# Patient Record
Sex: Female | Born: 1950 | Race: White | Hispanic: No | Marital: Married | Smoking: Former smoker
Health system: Southern US, Community
[De-identification: ages and names within clinical notes are randomized; demographics above are authoritative.]

## PROBLEM LIST (undated history)

## (undated) DIAGNOSIS — G4730 Sleep apnea, unspecified: Secondary | ICD-10-CM

## (undated) DIAGNOSIS — I2510 Atherosclerotic heart disease of native coronary artery without angina pectoris: Secondary | ICD-10-CM

## (undated) DIAGNOSIS — M1990 Unspecified osteoarthritis, unspecified site: Secondary | ICD-10-CM

## (undated) DIAGNOSIS — I10 Essential (primary) hypertension: Secondary | ICD-10-CM

## (undated) DIAGNOSIS — E119 Type 2 diabetes mellitus without complications: Secondary | ICD-10-CM

## (undated) HISTORY — PX: BACK SURGERY: SHX140

## (undated) HISTORY — PX: CARDIAC CATHETERIZATION: SHX172

---
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
## 1992-01-19 HISTORY — PX: HERNIA REPAIR: SHX51

## 1994-01-18 HISTORY — PX: OTHER SURGICAL HISTORY: SHX169

## 1994-01-18 HISTORY — PX: HERNIA REPAIR: SHX51

## 1994-01-18 HISTORY — PX: ABDOMINAL HYSTERECTOMY: SHX81

## 2000-01-19 HISTORY — PX: CARPAL TUNNEL RELEASE: SHX101

## 2002-01-18 HISTORY — PX: OTHER SURGICAL HISTORY: SHX169

## 2006-01-18 HISTORY — PX: CATARACT EXTRACTION: SUR2

## 2013-12-18 HISTORY — PX: CORONARY ARTERY BYPASS GRAFT: SHX141

## 2015-01-19 HISTORY — PX: CARPAL TUNNEL RELEASE: SHX101

## 2015-01-19 HISTORY — PX: CATARACT EXTRACTION: SUR2

## 2015-01-19 HISTORY — PX: TRIGGER FINGER RELEASE: SHX641

## 2016-11-02 ENCOUNTER — Other Ambulatory Visit: Payer: Self-pay | Admitting: Neurological Surgery

## 2016-11-15 NOTE — Pre-Procedure Instructions (Signed)
Laura Randall  11/15/2016      [id number] - [place], [place] - [address] AT [facility] [address]) [address] [place] [address] Phone: [phone] Fax: [phone]    Your procedure is scheduled on Mon. Nov. 5  Report to [facility] Admitting at 11:15 A.M.  Call this number if you have problems the morning of surgery:  [phone]   Remember:  Do not eat food or drink liquids after midnight on Sun. Nov. 4   Take these medicines the morning of surgery with A SIP OF WATER :gabapentin (neurontin), metoprolol (toprol-XL) pantoprazole(protonix)                   How to Manage Your Diabetes Before and After Surgery  Why is it important to control my blood sugar before and after surgery? . Improving blood sugar levels before and after surgery helps healing and can limit problems. . A way of improving blood sugar control is eating a healthy diet by: o  Eating less sugar and carbohydrates o  Increasing activity/exercise o  Talking with your doctor about reaching your blood sugar goals . High blood sugars (greater than 180 mg/dL) can raise your risk of infections and slow your recovery, so you will need to focus on controlling your diabetes during the weeks before surgery. . Make sure that the doctor who takes care of your diabetes knows about your planned surgery including the date and location.  How do I manage my blood sugar before surgery? . Check your blood sugar at least 4 times a day, starting 2 days before surgery, to make sure that the level is not too high or low. o Check your blood sugar the morning of your surgery when you wake up and every 2 hours until you get to the Short Stay unit. . If your blood sugar is less than 70 mg/dL, you will need to treat for low blood sugar: o Do not take insulin. o Treat a low blood sugar (less than 70 mg/dL) with  cup of clear juice (cranberry or apple), 4  glucose tablets, OR glucose gel. o Recheck blood sugar in 15 minutes after treatment (to make sure it is greater than 70 mg/dL). If your blood sugar is not greater than 70 mg/dL on recheck, call [phone] for further instructions. . Report your blood sugar to the short stay nurse when you get to Short Stay.  . If you are admitted to the hospital after surgery: o Your blood sugar will be checked by the staff and you will probably be given insulin after surgery (instead of oral diabetes medicines) to make sure you have good blood sugar levels. o The goal for blood sugar control after surgery is 80-180 mg/dL.        WHAT DO I DO ABOUT MY DIABETES MEDICATION?   Marland Kitchen Do not take oral diabetes medicines (pills) the morning of surgery.  . THE NIGHT BEFORE SURGERY, take _______70____ units of ____humalog 75/25_______insulin.       . THE MORNING OF SURGERY, take ________none_____ units of ____humalog______insulin.   Other Instructions:                  7 days prior to surgery STOP taking any Aspirin (unless otherwise instructed by your surgeon), Aleve, Naproxen, Ibuprofen, Motrin, Advil, Goody's, BC's, all herbal medications, fish oil, and all vitamins   Do not  wear jewelry, make-up or nail polish.  Do not wear lotions, powders, or perfumes, or deoderant.  Do not shave 48 hours prior to surgery.  Men may shave face and neck.  Do not bring valuables to the hospital.  [facility] is not responsible for any belongings or valuables.  Contacts, dentures or bridgework may not be worn into surgery.  Leave your suitcase in the car.  After surgery it may be brought to your room.  For patients admitted to the hospital, discharge time will be determined by your treatment team.  Patients discharged the day of surgery will not be allowed to drive home.    Special instructions:   [facility]- Preparing For Surgery  Before surgery, you can play an important role. Because skin is not sterile,  your skin needs to be as free of germs as possible. You can reduce the number of germs on your skin by washing with CHG (chlorahexidine gluconate) Soap before surgery.  CHG is an antiseptic cleaner which kills germs and bonds with the skin to continue killing germs even after washing.  Please do not use if you have an allergy to CHG or antibacterial soaps. If your skin becomes reddened/irritated stop using the CHG.  Do not shave (including legs and underarms) for at least 48 hours prior to first CHG shower. It is OK to shave your face.  Please follow these instructions carefully.   1. Shower the NIGHT BEFORE SURGERY and the MORNING OF SURGERY with CHG.   2. If you chose to wash your hair, wash your hair first as usual with your normal shampoo.  3. After you shampoo, rinse your hair and body thoroughly to remove the shampoo.  4. Use CHG as you would any other liquid soap. You can apply CHG directly to the skin and wash gently with a scrungie or a clean washcloth.   5. Apply the CHG Soap to your body ONLY FROM THE NECK DOWN.  Do not use on open wounds or open sores. Avoid contact with your eyes, ears, mouth and genitals (private parts). Wash Face and genitals (private parts)  with your normal soap.  6. Wash thoroughly, paying special attention to the area where your surgery will be performed.  7. Thoroughly rinse your body with warm water from the neck down.  8. DO NOT shower/wash with your normal soap after using and rinsing off the CHG Soap.  9. Pat yourself dry with a CLEAN TOWEL.  10. Wear CLEAN PAJAMAS to bed the night before surgery, wear comfortable clothes the morning of surgery  11. Place CLEAN SHEETS on your bed the night of your first shower and DO NOT SLEEP WITH PETS.    Day of Surgery: Do not apply any deodorants/lotions. Please wear clean clothes to the hospital/surgery center.      Please read over the following fact sheets that you were given. Coughing and Deep  Breathing, MRSA Information and Surgical Site Infection Prevention

## 2016-11-16 ENCOUNTER — Encounter (HOSPITAL_COMMUNITY)
Admission: RE | Admit: 2016-11-16 | Discharge: 2016-11-16 | Disposition: A | Discharge status: Home / Self Care | Payer: 59 | Source: Ambulatory Visit | Attending: Neurological Surgery | Admitting: Neurological Surgery

## 2016-11-16 ENCOUNTER — Encounter (HOSPITAL_COMMUNITY): Payer: Self-pay | Admitting: *Deleted

## 2016-11-16 DIAGNOSIS — Z79899 Other long term (current) drug therapy: Secondary | ICD-10-CM | POA: Insufficient documentation

## 2016-11-16 DIAGNOSIS — Z7982 Long term (current) use of aspirin: Secondary | ICD-10-CM | POA: Diagnosis not present

## 2016-11-16 DIAGNOSIS — Z01818 Encounter for other preprocedural examination: Secondary | ICD-10-CM | POA: Insufficient documentation

## 2016-11-16 DIAGNOSIS — Z794 Long term (current) use of insulin: Secondary | ICD-10-CM | POA: Insufficient documentation

## 2016-11-16 DIAGNOSIS — M48062 Spinal stenosis, lumbar region with neurogenic claudication: Secondary | ICD-10-CM | POA: Insufficient documentation

## 2016-11-16 HISTORY — DX: Type 2 diabetes mellitus without complications: E11.9

## 2016-11-16 HISTORY — DX: Sleep apnea, unspecified: G47.30

## 2016-11-16 HISTORY — DX: Atherosclerotic heart disease of native coronary artery without angina pectoris: I25.10

## 2016-11-16 HISTORY — DX: Essential (primary) hypertension: I10

## 2016-11-16 HISTORY — DX: Unspecified osteoarthritis, unspecified site: M19.90

## 2016-11-16 LAB — GLUCOSE, CAPILLARY: GLUCOSE-CAPILLARY: 89 mg/dL (ref 65–99)

## 2016-11-16 LAB — SURGICAL PCR SCREEN
MRSA, PCR: NEGATIVE
Staphylococcus aureus: NEGATIVE

## 2016-11-16 LAB — BASIC METABOLIC PANEL
ANION GAP: 11 (ref 5–15)
BUN: 15 mg/dL (ref 6–20)
CALCIUM: 9.6 mg/dL (ref 8.9–10.3)
CO2: 25 mmol/L (ref 22–32)
CREATININE: 1.05 mg/dL — AB (ref 0.44–1.00)
Chloride: 101 mmol/L (ref 101–111)
GFR, EST NON AFRICAN AMERICAN: 55 mL/min — AB (ref >60)
Glucose, Bld: 94 mg/dL (ref 65–99)
Potassium: 3.6 mmol/L (ref 3.5–5.1)
Sodium: 137 mmol/L (ref 135–145)

## 2016-11-16 LAB — CBC
HCT: 39.9 % (ref 36.0–46.0)
HEMOGLOBIN: 13.7 g/dL (ref 12.0–15.0)
MCH: 30.2 pg (ref 26.0–34.0)
MCHC: 34.3 g/dL (ref 30.0–36.0)
MCV: 88.1 fL (ref 78.0–100.0)
PLATELETS: 208 10*3/uL (ref 150–400)
RBC: 4.53 MIL/uL (ref 3.87–5.11)
RDW: 13.5 % (ref 11.5–15.5)
WBC: 9 10*3/uL (ref 4.0–10.5)

## 2016-11-16 LAB — HEMOGLOBIN A1C
HEMOGLOBIN A1C: 7.6 % — AB (ref 4.8–5.6)
MEAN PLASMA GLUCOSE: 171.42 mg/dL

## 2016-11-16 NOTE — Progress Notes (Addendum)
PCP - debra renee Tegeler Cardiologist - dr. Selena BattenKim - requesting records  Chest x-ray - not needed EKG - 07/09/16 Stress Test - requesting ECHO - 2018 requesting Cardiac Cath - 2015 requesting from [facility]  Sending to anesthesia for review of records that have been requested   Fasting - 100-130 Checks 1-2 times a week  Wears CPAP at night Sleep study >25 years ago  Patient denies shortness of breath, fever, cough and chest pain at PAT appointment   Patient verbalized understanding of instructions that were given to them at the PAT appointment. Patient was also instructed that they will need to review over the PAT instructions again at home before surgery.

## 2016-11-17 ENCOUNTER — Encounter (HOSPITAL_COMMUNITY): Payer: Self-pay

## 2016-11-17 ENCOUNTER — Encounter (HOSPITAL_COMMUNITY): Payer: Self-pay | Admitting: Vascular Surgery
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
## 2016-11-18 ENCOUNTER — Other Ambulatory Visit: Payer: Self-pay | Admitting: Neurological Surgery

## 2016-11-19 MED ORDER — DEXTROSE 5 % IV SOLN
3.0000 g | INTRAVENOUS | Status: AC
  Administered 2016-11-22: 3 g via INTRAVENOUS
  Filled 2016-11-19 (×2): qty 3000

## 2016-11-22 ENCOUNTER — Ambulatory Visit (HOSPITAL_COMMUNITY): Payer: 59 | Admitting: Certified Registered Nurse Anesthetist

## 2016-11-22 ENCOUNTER — Observation Stay (HOSPITAL_COMMUNITY)
Admission: RE | Admit: 2016-11-22 | Discharge: 2016-11-23 | Disposition: A | Discharge status: Home / Self Care | Payer: 59 | Source: Ambulatory Visit | Attending: Neurological Surgery | Admitting: Neurological Surgery

## 2016-11-22 ENCOUNTER — Encounter (HOSPITAL_COMMUNITY): Payer: Self-pay | Admitting: Anesthesiology

## 2016-11-22 ENCOUNTER — Ambulatory Visit (HOSPITAL_COMMUNITY): Payer: 59

## 2016-11-22 ENCOUNTER — Encounter (HOSPITAL_COMMUNITY): Admission: RE | Payer: Self-pay | Source: Ambulatory Visit

## 2016-11-22 ENCOUNTER — Ambulatory Visit (HOSPITAL_COMMUNITY): Admission: RE | Admit: 2016-11-22 | Payer: 59 | Source: Ambulatory Visit | Admitting: Neurological Surgery

## 2016-11-22 ENCOUNTER — Encounter (HOSPITAL_COMMUNITY)
Admission: RE | Disposition: A | Discharge status: Home / Self Care | Payer: Self-pay | Source: Ambulatory Visit | Attending: Neurological Surgery

## 2016-11-22 DIAGNOSIS — Z87891 Personal history of nicotine dependence: Secondary | ICD-10-CM | POA: Diagnosis not present

## 2016-11-22 DIAGNOSIS — Z6841 Body Mass Index (BMI) 40.0 and over, adult: Secondary | ICD-10-CM | POA: Diagnosis not present

## 2016-11-22 DIAGNOSIS — K219 Gastro-esophageal reflux disease without esophagitis: Secondary | ICD-10-CM | POA: Insufficient documentation

## 2016-11-22 DIAGNOSIS — Z7982 Long term (current) use of aspirin: Secondary | ICD-10-CM | POA: Diagnosis not present

## 2016-11-22 DIAGNOSIS — M5126 Other intervertebral disc displacement, lumbar region: Secondary | ICD-10-CM | POA: Insufficient documentation

## 2016-11-22 DIAGNOSIS — Z951 Presence of aortocoronary bypass graft: Secondary | ICD-10-CM | POA: Diagnosis not present

## 2016-11-22 DIAGNOSIS — I2510 Atherosclerotic heart disease of native coronary artery without angina pectoris: Secondary | ICD-10-CM | POA: Insufficient documentation

## 2016-11-22 DIAGNOSIS — E119 Type 2 diabetes mellitus without complications: Secondary | ICD-10-CM | POA: Insufficient documentation

## 2016-11-22 DIAGNOSIS — G4730 Sleep apnea, unspecified: Secondary | ICD-10-CM | POA: Insufficient documentation

## 2016-11-22 DIAGNOSIS — Z419 Encounter for procedure for purposes other than remedying health state, unspecified: Secondary | ICD-10-CM

## 2016-11-22 DIAGNOSIS — I10 Essential (primary) hypertension: Secondary | ICD-10-CM | POA: Diagnosis not present

## 2016-11-22 DIAGNOSIS — M47816 Spondylosis without myelopathy or radiculopathy, lumbar region: Secondary | ICD-10-CM | POA: Diagnosis not present

## 2016-11-22 DIAGNOSIS — Z794 Long term (current) use of insulin: Secondary | ICD-10-CM | POA: Diagnosis not present

## 2016-11-22 DIAGNOSIS — Z79899 Other long term (current) drug therapy: Secondary | ICD-10-CM | POA: Insufficient documentation

## 2016-11-22 DIAGNOSIS — M48062 Spinal stenosis, lumbar region with neurogenic claudication: Secondary | ICD-10-CM | POA: Diagnosis not present

## 2016-11-22 HISTORY — PX: LUMBAR LAMINECTOMY/DECOMPRESSION MICRODISCECTOMY: SHX5026

## 2016-11-22 LAB — GLUCOSE, CAPILLARY
GLUCOSE-CAPILLARY: 314 mg/dL — AB (ref 65–99)
Glucose-Capillary: 145 mg/dL — ABNORMAL HIGH (ref 65–99)
Glucose-Capillary: 122 mg/dL — ABNORMAL HIGH (ref 65–99)
Glucose-Capillary: 116 mg/dL — ABNORMAL HIGH (ref 65–99)

## 2016-11-22 SURGERY — LUMBAR LAMINECTOMY/DECOMPRESSION MICRODISCECTOMY 2 LEVELS
Anesthesia: General | Site: Back | Laterality: Bilateral

## 2016-11-22 SURGERY — LUMBAR LAMINECTOMY WITH COFLEX 2 LEVEL
Anesthesia: General | Site: Back | Laterality: Bilateral

## 2016-11-22 MED ORDER — FLEET ENEMA 7-19 GM/118ML RE ENEM: 1.0000 | ENEMA | Freq: Once | RECTAL | Status: DC | PRN

## 2016-11-22 MED ORDER — ACETAMINOPHEN 325 MG PO TABS: 650.0000 mg | ORAL_TABLET | ORAL | Status: DC | PRN

## 2016-11-22 MED ORDER — SODIUM CHLORIDE 0.9% FLUSH
3.0000 mL | Freq: Two times a day (BID) | INTRAVENOUS | Status: DC
  Administered 2016-11-22: 3 mL via INTRAVENOUS

## 2016-11-22 MED ORDER — PANTOPRAZOLE SODIUM 40 MG PO TBEC
40.0000 mg | DELAYED_RELEASE_TABLET | Freq: Every day | ORAL | Status: DC
  Administered 2016-11-23: 40 mg via ORAL
  Filled 2016-11-22: qty 1

## 2016-11-22 MED ORDER — DOCUSATE SODIUM 100 MG PO CAPS
100.0000 mg | ORAL_CAPSULE | Freq: Two times a day (BID) | ORAL | Status: DC
  Administered 2016-11-22 – 2016-11-23 (×2): 100 mg via ORAL
  Filled 2016-11-22 (×2): qty 1

## 2016-11-22 MED ORDER — ROCURONIUM BROMIDE 10 MG/ML (PF) SYRINGE
PREFILLED_SYRINGE | INTRAVENOUS | Status: AC
  Filled 2016-11-22: qty 5

## 2016-11-22 MED ORDER — BISACODYL 10 MG RE SUPP: 10.0000 mg | Freq: Every day | RECTAL | Status: DC | PRN

## 2016-11-22 MED ORDER — DEXAMETHASONE SODIUM PHOSPHATE 10 MG/ML IJ SOLN
INTRAMUSCULAR | Status: AC
  Filled 2016-11-22: qty 1

## 2016-11-22 MED ORDER — METHOCARBAMOL 500 MG PO TABS
500.0000 mg | ORAL_TABLET | Freq: Four times a day (QID) | ORAL | Status: DC | PRN
  Administered 2016-11-22 – 2016-11-23 (×2): 500 mg via ORAL
  Filled 2016-11-22 (×2): qty 1

## 2016-11-22 MED ORDER — CEFAZOLIN SODIUM-DEXTROSE 2-4 GM/100ML-% IV SOLN
2.0000 g | Freq: Three times a day (TID) | INTRAVENOUS | Status: AC
  Administered 2016-11-22 – 2016-11-23 (×2): 2 g via INTRAVENOUS
  Filled 2016-11-22 (×2): qty 100

## 2016-11-22 MED ORDER — LIDOCAINE-EPINEPHRINE 1 %-1:100000 IJ SOLN
INTRAMUSCULAR | Status: DC | PRN
  Administered 2016-11-22: 5 mL

## 2016-11-22 MED ORDER — INSULIN LISPRO PROT & LISPRO (75-25 MIX) 100 UNIT/ML KWIKPEN: 95.0000 [IU] | PEN_INJECTOR | SUBCUTANEOUS | Status: DC

## 2016-11-22 MED ORDER — THROMBIN (RECOMBINANT) 5000 UNITS EX SOLR
OROMUCOSAL | Status: DC | PRN
  Administered 2016-11-22: 5 mL via TOPICAL

## 2016-11-22 MED ORDER — ONDANSETRON HCL 4 MG PO TABS: 4.0000 mg | ORAL_TABLET | Freq: Four times a day (QID) | ORAL | Status: DC | PRN

## 2016-11-22 MED ORDER — METOPROLOL SUCCINATE ER 25 MG PO TB24
100.0000 mg | ORAL_TABLET | Freq: Every day | ORAL | Status: DC
  Filled 2016-11-22: qty 4

## 2016-11-22 MED ORDER — GABAPENTIN 100 MG PO CAPS
100.0000 mg | ORAL_CAPSULE | Freq: Two times a day (BID) | ORAL | Status: DC
  Administered 2016-11-22 – 2016-11-23 (×2): 100 mg via ORAL
  Filled 2016-11-22 (×2): qty 1

## 2016-11-22 MED ORDER — ONDANSETRON HCL 4 MG/2ML IJ SOLN
INTRAMUSCULAR | Status: DC | PRN
  Administered 2016-11-22: 4 mg via INTRAVENOUS

## 2016-11-22 MED ORDER — BUPIVACAINE HCL (PF) 0.5 % IJ SOLN
INTRAMUSCULAR | Status: DC | PRN
  Administered 2016-11-22: 20 mL
  Administered 2016-11-22: 5 mL

## 2016-11-22 MED ORDER — ONDANSETRON HCL 4 MG/2ML IJ SOLN: 4.0000 mg | Freq: Four times a day (QID) | INTRAMUSCULAR | Status: DC | PRN

## 2016-11-22 MED ORDER — SODIUM CHLORIDE 0.9% FLUSH: 3.0000 mL | INTRAVENOUS | Status: DC | PRN

## 2016-11-22 MED ORDER — MENTHOL 3 MG MT LOZG
1.0000 | LOZENGE | OROMUCOSAL | Status: DC | PRN
  Filled 2016-11-22: qty 9

## 2016-11-22 MED ORDER — FENTANYL CITRATE (PF) 100 MCG/2ML IJ SOLN: 25.0000 ug | INTRAMUSCULAR | Status: DC | PRN

## 2016-11-22 MED ORDER — FENTANYL CITRATE (PF) 250 MCG/5ML IJ SOLN
INTRAMUSCULAR | Status: AC
  Filled 2016-11-22: qty 5

## 2016-11-22 MED ORDER — SENNA 8.6 MG PO TABS
1.0000 | ORAL_TABLET | Freq: Two times a day (BID) | ORAL | Status: DC
  Administered 2016-11-22 – 2016-11-23 (×2): 8.6 mg via ORAL
  Filled 2016-11-22 (×2): qty 1

## 2016-11-22 MED ORDER — GLIMEPIRIDE 2 MG PO TABS
8.0000 mg | ORAL_TABLET | Freq: Every day | ORAL | Status: DC
  Filled 2016-11-22 (×2): qty 4

## 2016-11-22 MED ORDER — ROCURONIUM BROMIDE 100 MG/10ML IV SOLN
INTRAVENOUS | Status: DC | PRN
  Administered 2016-11-22: 50 mg via INTRAVENOUS

## 2016-11-22 MED ORDER — PHENOL 1.4 % MT LIQD: 1.0000 | OROMUCOSAL | Status: DC | PRN

## 2016-11-22 MED ORDER — METHOCARBAMOL 1000 MG/10ML IJ SOLN
500.0000 mg | Freq: Four times a day (QID) | INTRAVENOUS | Status: DC | PRN
  Filled 2016-11-22: qty 5

## 2016-11-22 MED ORDER — EPHEDRINE SULFATE 50 MG/ML IJ SOLN
INTRAMUSCULAR | Status: DC | PRN
  Administered 2016-11-22: 5 mg via INTRAVENOUS

## 2016-11-22 MED ORDER — INSULIN ASPART PROT & ASPART (70-30 MIX) 100 UNIT/ML ~~LOC~~ SUSP
100.0000 [IU] | Freq: Every day | SUBCUTANEOUS | Status: DC
  Administered 2016-11-23: 100 [IU] via SUBCUTANEOUS

## 2016-11-22 MED ORDER — DEXAMETHASONE SODIUM PHOSPHATE 10 MG/ML IJ SOLN
INTRAMUSCULAR | Status: DC | PRN
  Administered 2016-11-22: 10 mg via INTRAVENOUS

## 2016-11-22 MED ORDER — PROPOFOL 10 MG/ML IV BOLUS
INTRAVENOUS | Status: DC | PRN
  Administered 2016-11-22: 120 mg via INTRAVENOUS

## 2016-11-22 MED ORDER — LOSARTAN POTASSIUM-HCTZ 100-25 MG PO TABS: 1.0000 | ORAL_TABLET | Freq: Every day | ORAL | Status: DC

## 2016-11-22 MED ORDER — LACTATED RINGERS IV SOLN
INTRAVENOUS | Status: DC
  Administered 2016-11-22 (×2): via INTRAVENOUS

## 2016-11-22 MED ORDER — HYDROCODONE-ACETAMINOPHEN 5-325 MG PO TABS
2.0000 | ORAL_TABLET | ORAL | Status: DC | PRN
  Administered 2016-11-22: 2 via ORAL
  Filled 2016-11-22: qty 2

## 2016-11-22 MED ORDER — LIDOCAINE 2% (20 MG/ML) 5 ML SYRINGE
INTRAMUSCULAR | Status: AC
  Filled 2016-11-22: qty 5

## 2016-11-22 MED ORDER — POLYETHYLENE GLYCOL 3350 17 G PO PACK: 17.0000 g | PACK | Freq: Every day | ORAL | Status: DC | PRN

## 2016-11-22 MED ORDER — LIDOCAINE HCL (CARDIAC) 20 MG/ML IV SOLN
INTRAVENOUS | Status: DC | PRN
  Administered 2016-11-22: 80 mg via INTRAVENOUS

## 2016-11-22 MED ORDER — SUCCINYLCHOLINE CHLORIDE 200 MG/10ML IV SOSY
PREFILLED_SYRINGE | INTRAVENOUS | Status: AC
  Filled 2016-11-22: qty 10

## 2016-11-22 MED ORDER — PROPOFOL 10 MG/ML IV BOLUS
INTRAVENOUS | Status: AC
  Filled 2016-11-22: qty 20

## 2016-11-22 MED ORDER — DIPHENHYDRAMINE HCL 25 MG PO CAPS
50.0000 mg | ORAL_CAPSULE | ORAL | Status: DC | PRN
  Administered 2016-11-22: 50 mg via ORAL
  Filled 2016-11-22: qty 2

## 2016-11-22 MED ORDER — ALUM & MAG HYDROXIDE-SIMETH 200-200-20 MG/5ML PO SUSP: 30.0000 mL | Freq: Four times a day (QID) | ORAL | Status: DC | PRN

## 2016-11-22 MED ORDER — SUGAMMADEX SODIUM 200 MG/2ML IV SOLN
INTRAVENOUS | Status: DC | PRN
  Administered 2016-11-22: 200 mg via INTRAVENOUS

## 2016-11-22 MED ORDER — LOSARTAN POTASSIUM 50 MG PO TABS
100.0000 mg | ORAL_TABLET | Freq: Every day | ORAL | Status: DC
  Administered 2016-11-22 – 2016-11-23 (×2): 100 mg via ORAL
  Filled 2016-11-22 (×2): qty 2

## 2016-11-22 MED ORDER — HYDROMORPHONE HCL 1 MG/ML IJ SOLN
0.5000 mg | INTRAMUSCULAR | Status: DC | PRN
  Administered 2016-11-23: 0.5 mg via INTRAVENOUS
  Filled 2016-11-22: qty 0.5

## 2016-11-22 MED ORDER — ACETAMINOPHEN 650 MG RE SUPP: 650.0000 mg | RECTAL | Status: DC | PRN

## 2016-11-22 MED ORDER — SODIUM CHLORIDE 0.9 % IR SOLN
Status: DC | PRN
  Administered 2016-11-22: 500 mL

## 2016-11-22 MED ORDER — INSULIN ASPART PROT & ASPART (70-30 MIX) 100 UNIT/ML ~~LOC~~ SUSP
95.0000 [IU] | Freq: Every day | SUBCUTANEOUS | Status: DC
  Administered 2016-11-22: 95 [IU] via SUBCUTANEOUS
  Filled 2016-11-22 (×2): qty 10

## 2016-11-22 MED ORDER — FENTANYL CITRATE (PF) 100 MCG/2ML IJ SOLN
INTRAMUSCULAR | Status: DC | PRN
  Administered 2016-11-22: 150 ug via INTRAVENOUS
  Administered 2016-11-22 (×2): 50 ug via INTRAVENOUS

## 2016-11-22 MED ORDER — PROMETHAZINE HCL 25 MG/ML IJ SOLN: 6.2500 mg | INTRAMUSCULAR | Status: DC | PRN

## 2016-11-22 MED ORDER — EPHEDRINE 5 MG/ML INJ
INTRAVENOUS | Status: AC
  Filled 2016-11-22: qty 10

## 2016-11-22 MED ORDER — SUGAMMADEX SODIUM 200 MG/2ML IV SOLN
INTRAVENOUS | Status: AC
  Filled 2016-11-22: qty 2

## 2016-11-22 MED ORDER — PHENYLEPHRINE 40 MCG/ML (10ML) SYRINGE FOR IV PUSH (FOR BLOOD PRESSURE SUPPORT)
PREFILLED_SYRINGE | INTRAVENOUS | Status: AC
  Filled 2016-11-22: qty 10

## 2016-11-22 MED ORDER — MIDAZOLAM HCL 2 MG/2ML IJ SOLN
INTRAMUSCULAR | Status: AC
  Filled 2016-11-22: qty 2

## 2016-11-22 MED ORDER — HYDROCHLOROTHIAZIDE 25 MG PO TABS
25.0000 mg | ORAL_TABLET | Freq: Every day | ORAL | Status: DC
  Administered 2016-11-22 – 2016-11-23 (×2): 25 mg via ORAL
  Filled 2016-11-22 (×2): qty 1

## 2016-11-22 MED ORDER — ONDANSETRON HCL 4 MG/2ML IJ SOLN
INTRAMUSCULAR | Status: AC
  Filled 2016-11-22: qty 2

## 2016-11-22 SURGICAL SUPPLY — 48 items
BAG DECANTER FOR FLEXI CONT (MISCELLANEOUS) ×2 IMPLANT
BLADE CLIPPER SURG (BLADE) IMPLANT
BLADE SURG 10 STRL SS (BLADE) ×2 IMPLANT
BUR ACORN 6.0 (BURR) IMPLANT
BUR MATCHSTICK NEURO 3.0 LAGG (BURR) ×2 IMPLANT
CANISTER SUCT 3000ML PPV (MISCELLANEOUS) ×2 IMPLANT
CARTRIDGE OIL MAESTRO DRILL (MISCELLANEOUS) ×1 IMPLANT
DECANTER SPIKE VIAL GLASS SM (MISCELLANEOUS) ×2 IMPLANT
DERMABOND ADVANCED (GAUZE/BANDAGES/DRESSINGS) ×1
DERMABOND ADVANCED .7 DNX12 (GAUZE/BANDAGES/DRESSINGS) ×1 IMPLANT
DIFFUSER DRILL AIR PNEUMATIC (MISCELLANEOUS) ×2 IMPLANT
DRAPE HALF SHEET 40X57 (DRAPES) IMPLANT
DRAPE LAPAROTOMY 100X72X124 (DRAPES) ×2 IMPLANT
DRAPE MICROSCOPE LEICA (MISCELLANEOUS) IMPLANT
DRAPE POUCH INSTRU U-SHP 10X18 (DRAPES) ×2 IMPLANT
DRSG OPSITE POSTOP 3X4 (GAUZE/BANDAGES/DRESSINGS) ×2 IMPLANT
DURAPREP 26ML APPLICATOR (WOUND CARE) ×2 IMPLANT
ELECT REM PT RETURN 9FT ADLT (ELECTROSURGICAL) ×2
ELECTRODE REM PT RTRN 9FT ADLT (ELECTROSURGICAL) ×1 IMPLANT
GAUZE SPONGE 4X4 12PLY STRL (GAUZE/BANDAGES/DRESSINGS) ×2 IMPLANT
GAUZE SPONGE 4X4 16PLY XRAY LF (GAUZE/BANDAGES/DRESSINGS) IMPLANT
GLOVE BIOGEL PI IND STRL 8.5 (GLOVE) ×2 IMPLANT
GLOVE BIOGEL PI INDICATOR 8.5 (GLOVE) ×2
GLOVE ECLIPSE 8.5 STRL (GLOVE) ×2 IMPLANT
GOWN STRL REUS W/ TWL LRG LVL3 (GOWN DISPOSABLE) ×1 IMPLANT
GOWN STRL REUS W/ TWL XL LVL3 (GOWN DISPOSABLE) IMPLANT
GOWN STRL REUS W/TWL 2XL LVL3 (GOWN DISPOSABLE) ×2 IMPLANT
GOWN STRL REUS W/TWL LRG LVL3 (GOWN DISPOSABLE) ×1
GOWN STRL REUS W/TWL XL LVL3 (GOWN DISPOSABLE)
HEMOSTAT POWDER KIT SURGIFOAM (HEMOSTASIS) ×2 IMPLANT
KIT BASIN OR (CUSTOM PROCEDURE TRAY) ×2 IMPLANT
KIT ROOM TURNOVER OR (KITS) ×2 IMPLANT
NEEDLE HYPO 22GX1.5 SAFETY (NEEDLE) ×2 IMPLANT
NEEDLE SPNL 20GX3.5 QUINCKE YW (NEEDLE) IMPLANT
NS IRRIG 1000ML POUR BTL (IV SOLUTION) ×2 IMPLANT
OIL CARTRIDGE MAESTRO DRILL (MISCELLANEOUS) ×2
PACK LAMINECTOMY NEURO (CUSTOM PROCEDURE TRAY) ×2 IMPLANT
PAD ARMBOARD 7.5X6 YLW CONV (MISCELLANEOUS) ×6 IMPLANT
PATTIES SURGICAL .5 X1 (DISPOSABLE) ×2 IMPLANT
RUBBERBAND STERILE (MISCELLANEOUS) IMPLANT
SPONGE SURGIFOAM ABS GEL SZ50 (HEMOSTASIS) IMPLANT
SUT VIC AB 1 CT1 18XBRD ANBCTR (SUTURE) ×1 IMPLANT
SUT VIC AB 1 CT1 8-18 (SUTURE) ×1
SUT VIC AB 2-0 CP2 18 (SUTURE) ×2 IMPLANT
SUT VIC AB 3-0 SH 8-18 (SUTURE) ×2 IMPLANT
TOWEL GREEN STERILE (TOWEL DISPOSABLE) ×2 IMPLANT
TOWEL GREEN STERILE FF (TOWEL DISPOSABLE) ×2 IMPLANT
WATER STERILE IRR 1000ML POUR (IV SOLUTION) ×2 IMPLANT

## 2016-11-22 NOTE — Transfer of Care (Signed)
Immediate Anesthesia Transfer of Care Note  Patient: Laura Randall  Procedure(s) Performed: Bilateral Limbar two -three,  Lumbar three-four Laminotomies (Bilateral Back)  Patient Location: PACU  Anesthesia Type:General  Level of Consciousness: awake, alert , oriented and patient cooperative  Airway & Oxygen Therapy: Patient Spontanous Breathing and Patient connected to nasal cannula oxygen  Post-op Assessment: Report given to RN, Post -op Vital signs reviewed and stable and Patient moving all extremities  Post vital signs: Reviewed and stable  Last Vitals:  Vitals:   11/22/16 1126  BP: (!) 181/76  Pulse: 90  Resp: 20  Temp: 36.7 C  SpO2: 98%    Last Pain:  Vitals:   11/22/16 1155  TempSrc:   PainSc: 8       Patients Stated Pain Goal: 3 (11/22/16 1155)  Complications: No apparent anesthesia complications

## 2016-11-22 NOTE — H&P (Addendum)
Chief complaint: pain in right knee and leg History of present illness: Laura Randall is a 66 year old right-handed individual who's had back issues in the past. [place] 2007 [place]on [place]Brown [place] she had decompression with placement of an X stop device at L4-L5.She had primarily left lower extremity pain at the time. She feels that didn't help as she is Left leg pain but now she started developed right leg pain. Seen an orthopedist sometime ago and was told that she had a stress fracture.She wore boot for a total of 10 days and noted that the pain did not seem to improve at all. She's had severe increase in pain an MRI of lumbar spine was ultimately performed. She notes that she has not had any back pain per se but notes that she's had left leg pain has persisted from before. She notes pain radiates from the crease of her hips down into the leg she describes that there is severe pain on the anterior border of the thigh. She tells me that over the past years she's had a number of epidural injections. She's had some injections in the hip and greater trochanter. The right leg pain she feels unrelenting. Having failed efforts at conservative management including repeat injections by myself we noted that L4-L5 as a widely patent canal but she has significant stenosis with central disc protrusion at L3-4. She is been advised regarding surgical decompression of this level and is now admitted for this process.   Past medical history reveals that she is diabetic and is on Humalog 95 units twice a day. She is on glimepiride pantoprazole metoprolol ER losartan buffered aspirin in addition to vitamin D. Allergies include hydrocodone morphine oxycodone metformin pravastatin and phentermine.  Physical examination: She is alert and oriented she walks with mild antalgia involving the right lower extremity. She has difficulty walking onto the toes of either foot. Deep 10 reflexes are absent in both Achilles straight leg  raising is markedly positive at 15 on the right side positive at 45 on the left side. Patrick's maneuver is negative bilaterally. Sensation appears diminished in the toes. Station and gait are otherwise intact cranial nerve examination is normal.  The heart has regular rate and rhythm the lungs are clear to auscultation the abdomen is soft bowel sounds positive no masses are noted extremities feel no cyanosis clubbing or edema.  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 She is now to undergo surgical decompression at L3-4 and 2- 3  at each of these levels.

## 2016-11-22 NOTE — Anesthesia Procedure Notes (Signed)
Procedure Name: Intubation Date/Time: 11/22/2016 3:10 PM Performed by: Shirlyn Goltz, CRNA Pre-anesthesia Checklist: Patient identified, Emergency Drugs available, Suction available and Patient being monitored Patient Re-evaluated:Patient Re-evaluated prior to induction Oxygen Delivery Method: Circle system utilized Preoxygenation: Pre-oxygenation with 100% oxygen Induction Type: IV induction Ventilation: Mask ventilation without difficulty Laryngoscope Size: Mac and 3 Grade View: Grade III Tube type: Oral Tube size: 7.0 mm Number of attempts: 1 Airway Equipment and Method: Stylet Placement Confirmation: ETT inserted through vocal cords under direct vision,  positive ETCO2 and breath sounds checked- equal and bilateral Secured at: 21 cm Tube secured with: Tape Dental Injury: Teeth and Oropharynx as per pre-operative assessment

## 2016-11-22 NOTE — Anesthesia Preprocedure Evaluation (Addendum)
Anesthesia Evaluation  Patient identified by MRN, date of birth, ID band Patient awake    Reviewed: Allergy & Precautions, NPO status , Patient's Chart, lab work & pertinent test results, reviewed documented beta blocker date and time   Airway Mallampati: III  TM Distance: >3 FB Neck ROM: Full    Dental no notable dental hx. (+) Teeth Intact   Pulmonary sleep apnea and Continuous Positive Airway Pressure Ventilation , former smoker,    Pulmonary exam normal breath sounds clear to auscultation       Cardiovascular hypertension, Pt. on medications and Pt. on home beta blockers + CAD and + CABG  Normal cardiovascular exam Rhythm:Regular Rate:Normal  %v CABG 2015 LVEF 50-55%   Neuro/Psych negative psych ROS   GI/Hepatic Neg liver ROS, GERD  Medicated and Controlled,  Endo/Other  diabetes, Poorly Controlled, Type 2, Insulin Dependent, Oral Hypoglycemic AgentsMorbid obesityHyperlipidemia  Renal/GU negative Renal ROS  negative genitourinary   Musculoskeletal  (+) Arthritis , Osteoarthritis,  Spinal stenosis with neurogenic claudication   Abdominal (+) + obese,   Peds  Hematology negative hematology ROS (+)   Anesthesia Other Findings   Reproductive/Obstetrics                             Anesthesia Physical Anesthesia Plan  ASA: III  Anesthesia Plan: General   Post-op Pain Management:    Induction: Intravenous  PONV Risk Score and Plan: 4 or greater and Midazolam, Propofol infusion, Dexamethasone, Ondansetron, Metaclopromide and Treatment may vary due to age or medical condition  Airway Management Planned: Oral ETT  Additional Equipment:   Intra-op Plan:   Post-operative Plan: Extubation in OR  Informed Consent: I have reviewed the patients History and Physical, chart, labs and discussed the procedure including the risks, benefits and alternatives for the proposed anesthesia with the  patient or authorized representative who has indicated his/her understanding and acceptance.   Dental advisory given  Plan Discussed with: CRNA, Anesthesiologist and Surgeon  Anesthesia Plan Comments:        Anesthesia Quick Evaluation

## 2016-11-22 NOTE — Progress Notes (Signed)
Vital signs are stable Motor function appears intact Stable postop

## 2016-11-22 NOTE — Op Note (Signed)
Date of surgery: 11/22/2016 Preoperative diagnosis:lumbar stenosis L3-4 and L2-3 history of decompression L4-5 Postoperative diagnosis:same Procedure:lumbar laminectomy L2-3 and L3-4 with bilateral laminotomies and foraminotomies decompress the L2 the L3 and the L4 nerve roots. Surgeon: Barnett AbuHenry Dailynn Nancarrow M.D. Assistant:Jeffrey Lovell SheehanJenkins M.D. Anesthesia: Gen. 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 Been advised regarding surgery having failed efforts at conservative management to this point.  Procedure: Patient was brought to the operating room supine on a stretcher. After the smooth induction of general endotracheal anesthesia he was turned prone onto the operating table. The back was prepped with alcohol and DuraPrep and draped in a sterile fashion. Localizing radiographs identified the interspace at34. A midline incision was created and carried down to the lumbar dorsal fascia which was opened on either side of midline at this level. The dissection was carried out over the interlaminar space and the facet joints atL3-4. A self-retaining retractor was placed in the wound. A high-speed drill was then used to remove the inferior margin of the lamina out to the medial wall the facet performing the initial portion of the dissection. The yellow ligament was then taken up and removed. Common dural tube was identified and dissection was carefully undertaken removing redundant yellow ligament and overgrown facet from the superior facet ofL3and the laminar arch ofL3. A foraminotomy was created over the L4nerve root.this procedure was then repeated on the opposite side. The L3 nerve root was decompressed  superiorly using a curved Kerrisons to facilitate this.  At L2-3 then laminotomies and foraminotomies were created in a similar fashion the L2 nerve root was decompressed superiorly and the L3 nerve root was decompressed as it traverses the disc space.  Once an adequate decompression was identified and secured, hemostasis and the soft tissues obtained meticulously and when verified retractor was removed the wound was irrigated copiously with antibiotic irrigating solution, and then the lumbar dorsal fascia was closed with #1 Vicryl in interrupted fashion.20 mLOf half percent Marcaine was injected into the paraspinous fascia. 2-0 Vicryl was used to close the subcutaneous fascia and 3-0 Vicryl was used to close the subcuticular skin. Blood loss was estimated as100 mL. The patient was returned to the recovery room in stable condition

## 2016-11-23 ENCOUNTER — Encounter (HOSPITAL_COMMUNITY): Payer: Self-pay | Admitting: Neurological Surgery

## 2016-11-23 DIAGNOSIS — M48062 Spinal stenosis, lumbar region with neurogenic claudication: Secondary | ICD-10-CM | POA: Diagnosis not present

## 2016-11-23 LAB — GLUCOSE, CAPILLARY
GLUCOSE-CAPILLARY: 256 mg/dL — AB (ref 65–99)
GLUCOSE-CAPILLARY: 231 mg/dL — AB (ref 65–99)

## 2016-11-23 MED ORDER — GLIMEPIRIDE 4 MG PO TABS
8.0000 mg | ORAL_TABLET | Freq: Every day | ORAL | Status: DC
  Administered 2016-11-23: 8 mg via ORAL
  Filled 2016-11-23: qty 2

## 2016-11-23 MED ORDER — TRAMADOL HCL 50 MG PO TABS: 50.0000 mg | ORAL_TABLET | Freq: Four times a day (QID) | ORAL | 0 refills | Status: AC | PRN

## 2016-11-23 MED ORDER — TRAMADOL HCL 50 MG PO TABS: ORAL_TABLET | ORAL | 3 refills | Status: AC

## 2016-11-23 MED ORDER — CONTINUOUS BLOOD GLUC SENSOR MISC: 1.0000 | 0 refills | Status: AC

## 2016-11-23 MED ORDER — METOPROLOL SUCCINATE ER 100 MG PO TB24
100.0000 mg | ORAL_TABLET | Freq: Every day | ORAL | Status: DC
  Administered 2016-11-23: 100 mg via ORAL
  Filled 2016-11-23: qty 1

## 2016-11-23 MED ORDER — METHOCARBAMOL 500 MG PO TABS: 500.0000 mg | ORAL_TABLET | Freq: Four times a day (QID) | ORAL | 3 refills | Status: AC | PRN

## 2016-11-23 MED ORDER — TRAMADOL HCL 50 MG PO TABS
100.0000 mg | ORAL_TABLET | Freq: Four times a day (QID) | ORAL | Status: DC | PRN
  Administered 2016-11-23: 100 mg via ORAL
  Filled 2016-11-23: qty 2

## 2016-11-23 MED ORDER — HYDROMORPHONE HCL 2 MG PO TABS: 2.0000 mg | ORAL_TABLET | ORAL | Status: DC | PRN

## 2016-11-23 NOTE — Progress Notes (Signed)
Sensor applied by patient to ( L) Arm at (4:00pm).   Thank you, Laura FischerJudy E. Zamara Cozad, RN, MSN, CDE  Diabetes Coordinator Inpatient Glycemic Control Team Team Pager [phone] (8am-5pm) 11/23/2016 4:05 PM

## 2016-11-23 NOTE — Plan of Care (Signed)
  Progressing Activity: Ability to avoid complications of mobility impairment will improve 11/23/2016 0542 - Progressing by Wille CelesteMadriaga-Acosta, Tekela Garguilo D, RN Ability to tolerate increased activity will improve 11/23/2016 0542 - Progressing by Wille CelesteMadriaga-Acosta, Marialuiza Car D, RN Will remain free from falls 11/23/2016 0542 - Progressing by Wille CelesteMadriaga-Acosta, Gayle Martinez D, RN Education: Understanding of discharge needs will improve 11/23/2016 0542 - Progressing by Wille CelesteMadriaga-Acosta, Shea Kapur D, RN Physical Regulation: Ability to maintain clinical measurements within normal limits will improve 11/23/2016 0542 - Progressing by Wille CelesteMadriaga-Acosta, Marquan Vokes D, RN Postoperative complications will be avoided or minimized 11/23/2016 0542 - Progressing by Wille CelesteMadriaga-Acosta, Bianca Raneri D, RN Pain Management: Pain level will decrease 11/23/2016 0542 - Progressing by Wille CelesteMadriaga-Acosta, Makenly Larabee D, RN Health Behavior/Discharge Planning: Identification of resources available to assist in meeting health care needs will improve 11/23/2016 0542 - Progressing by Wille CelesteMadriaga-Acosta, Kyerra Vargo D, RN

## 2016-11-23 NOTE — Progress Notes (Signed)
Patient alert and oriented, mae's well, voiding adequate amount of urine, swallowing without difficulty, no c/o pain at time of discharge. Patient discharged home with family. Script and discharged instructions given to patient. Patient and family stated understanding of instructions given. Patient has an appointment with Dr. Elsner  

## 2016-11-23 NOTE — Evaluation (Signed)
Physical Therapy Evaluation Patient Details Name: Laura GrossShirley J Becraft MRN: [id number] DOB: 06/14/1950 Today's Date: 11/23/2016   History of Present Illness  Patient is a 66yo female s/p laminectomy L2-3 and L3-4 with bilateral laminotomies and foraminotomies decompress the L2 the L3 and the L4 nerve roots  Clinical Impression  Patient seen for mobility assessment s/p spinal surgery. Mobilizing well. Educated patient on precautions, mobility expectations, safety and car transfers. No further acute PT needs. Will sign off.     Follow Up Recommendations No PT follow up;Supervision - Intermittent    Equipment Recommendations  None recommended by PT    Recommendations for Other Services       Precautions / Restrictions Precautions Precautions: Back Precaution Booklet Issued: Yes (comment) Precaution Comments: verbally reviewed with patient Restrictions Weight Bearing Restrictions: No      Mobility  Bed Mobility               General bed mobility comments: received in chair, reviewed technique  Transfers Overall transfer level: Needs assistance Equipment used: None Transfers: Sit to/from Stand Sit to Stand: Supervision         General transfer comment: no physical assist required  Ambulation/Gait Ambulation/Gait assistance: Supervision Ambulation Distance (Feet): 310 Feet Assistive device: None Gait Pattern/deviations: Trendelenburg;Wide base of support;Step-through pattern Gait velocity: decreased Gait velocity interpretation: Below normal speed for age/gender General Gait Details: slow but steady gait, 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 VCs for sequencing, no physical assist required  Wheelchair Mobility    Modified Rankin (Stroke Patients  Only)       Balance Overall balance assessment: No apparent balance deficits (not formally assessed)                                           Pertinent Vitals/Pain      Home Living Family/patient expects to be discharged to:: Private residence Living Arrangements: Spouse/significant other Available Help at Discharge: Family Type of Home: House Home Access: Stairs to enter Entrance Stairs-Rails: Can reach both Entrance Stairs-Number of Steps: 12 Home Layout: One level        Prior Function Level of Independence: Independent               Hand Dominance        Extremity/Trunk Assessment   Upper Extremity Assessment Upper Extremity Assessment: Overall WFL for tasks assessed    Lower Extremity Assessment Lower Extremity Assessment: Overall WFL for tasks assessed    Cervical / Trunk Assessment Cervical / Trunk Assessment: (increased body habitus s/p spinal surgery)  Communication      Cognition Arousal/Alertness: Awake/alert Behavior During Therapy: WFL for tasks assessed/performed Overall Cognitive Status: Within Functional Limits for tasks assessed                                        General Comments      Exercises     Assessment/Plan    PT Assessment Patent does not need any further PT services  PT Problem List         PT Treatment Interventions      PT Goals (Current  goals can be found in the Care Plan section)  Acute Rehab PT Goals Patient Stated Goal: to go home PT Goal Formulation: All assessment and education complete, DC therapy    Frequency     Barriers to discharge        Co-evaluation               AM-PAC PT "6 Clicks" Daily Activity  Outcome Measure Difficulty turning over in bed (including adjusting bedclothes, sheets and blankets)?: A Little Difficulty moving from lying on back to sitting on the side of the bed? : A Little Difficulty sitting down on and standing up from a  chair with arms (e.g., wheelchair, bedside commode, etc,.)?: A Little Help needed moving to and from a bed to chair (including a wheelchair)?: A Little Help needed walking in hospital room?: A Little Help needed climbing 3-5 steps with a railing? : A Little 6 Click Score: 18    End of Session   Activity Tolerance: Patient tolerated treatment well Patient left: in chair;with call bell/phone within reach Nurse Communication: Mobility status PT Visit Diagnosis: Difficulty in walking, not elsewhere classified (R26.2)    Time: [id number] PT Time Calculation (min) (ACUTE ONLY): 17 min   Charges:   PT Evaluation $PT Eval Low Complexity: 1 Low     PT G Codes:   PT G-Codes **NOT FOR INPATIENT CLASS** Functional Assessment Tool Used: Clinical judgement Functional Limitation: Mobility: Walking and moving around Mobility: Walking and Moving Around Current Status ([id number]): At least 1 percent but less than 20 percent impaired, limited or restricted Mobility: Walking and Moving Around Goal Status [phone]): At least 1 percent but less than 20 percent impaired, limited or restricted Mobility: Walking and Moving Around Discharge Status [phone](G8980): At least 1 percent but less than 20 percent impaired, limited or restricted    Charlotte Crumbevon Sonji Starkes, PT DPT  Board Certified Neurologic Specialist [phone]   Fabio AsaDevon J Laithan Conchas 11/23/2016, 9:06 AM

## 2016-11-23 NOTE — Discharge Summary (Signed)
Date of admission: 11/22/2016 Date of discharge: 11/23/2016 Admitting diagnosis: Lumbar spondylosis and stenosis L3-4 and L2-3, diabetes mellitus, morbid obesity Discharge and final diagnosis: Lumbar spondylosis and stenosis L3-4 L2-3. Diabetes mellitus. Morbid obesity. Condition on discharge: Stable, improved Hospital course: Patient was omitted to undergo surgical decompression at L2-3 and L3-4. Postoperatively she has felt improvement in her leg function and significantly decreased pain. Her incision is clean and dry. She is ambulatory. The patient is discharged home on tramadol 50 mg 1 or 2 tablets every 6 hours as needed for pain. She is also discharged home on methocarbamol 500 mg 1 every 6 hours when necessary spasms.

## 2016-11-23 NOTE — Evaluation (Signed)
Occupational Therapy Evaluation Patient Details Name: Laura Randall MRN: [id number] DOB: [birth year] Today's Date: 11/23/2016    History of Present Illness Patient is a 66yo female s/p laminectomy L2-3 and L3-4 with bilateral laminotomies and foraminotomies decompress the L2 the L3 and the L4 nerve roots   Clinical Impression   PTA, pt was living with her husband and was independent. Currently, pt requires supervision-Min Guard A for ADLs and functional mobility. Provided education on back precautions, LB ADLs with AE, toilet transfer, and tub transfer with shower seat; pt demonstrated understanding. Answered all pt questions. Recommend dc home once medically stable per physician. All acute OT needs met and will sign off. Thank you.     Follow Up Recommendations  No OT follow up;Supervision/Assistance - 24 hour    Equipment Recommendations  None recommended by OT    Recommendations for Other Services PT consult     Precautions / Restrictions Precautions Precautions: Back Precaution Booklet Issued: Yes (comment) Precaution Comments: verbally reviewed with patient. Min VCs for lift Restrictions Weight Bearing Restrictions: No      Mobility Bed Mobility Overal bed mobility: Needs Assistance Bed Mobility: Rolling;Sidelying to Sit Rolling: Min assist Sidelying to sit: Min guard       General bed mobility comments: Educated pt on log roll. Pt performing with Min A for log roll and VCs to bring BLEs over EOB  Transfers Overall transfer level: Needs assistance Equipment used: None Transfers: Sit to/from Stand Sit to Stand: Supervision         General transfer comment: no physical assist required    Balance Overall balance assessment: No apparent balance deficits (not formally assessed)                                         ADL either performed or assessed with clinical judgement   ADL Overall ADL's : Needs assistance/impaired                                        General ADL Comments: Pt performing ADLs and funcitonal mobility at supervision-Min Guard level. Provided pt with education on back precaution, AE for LB ADLs, compensatory toileting strategies, and tub transfer. Provided handout on AE for carry over to home and purchase. Pt demonstrating understanding. Feel pt will progress well with time     Vision         Perception     Praxis      Pertinent Vitals/Pain Pain Assessment: Faces Faces Pain Scale: Hurts a little bit Pain Location: Back Pain Intervention(s): Monitored during session     Hand Dominance Right   Extremity/Trunk Assessment Upper Extremity Assessment Upper Extremity Assessment: Overall WFL for tasks assessed   Lower Extremity Assessment Lower Extremity Assessment: Overall WFL for tasks assessed   Cervical / Trunk Assessment Cervical / Trunk Assessment: Other exceptions(increased body habitus s/p spinal surgery)   Communication Communication Communication: No difficulties   Cognition Arousal/Alertness: Awake/alert Behavior During Therapy: WFL for tasks assessed/performed Overall Cognitive Status: Within Functional Limits for tasks assessed                                     General Comments       Exercises  Shoulder Instructions      Home Living Family/patient expects to be discharged to:: Private residence Living Arrangements: Spouse/significant other Available Help at Discharge: Family Type of Home: House Home Access: Stairs to enter [employer] of Steps: 12 Entrance Stairs-Rails: Can reach both [place]: One level     Bathroom Shower/Tub: Piatt unit;Curtain   Biochemist, clinical: [place]: Environmental consultant - 2 wheels;Cane - single point;Wheelchair - Brewing technologist;Adaptive equipment Adaptive Equipment: Reacher        Prior Functioning/Environment Level of Independence: Independent        Comments:  ADLs, IADLs, driving, and working         OT Problem List: Decreased range of motion;Impaired balance (sitting and/or standing);Decreased knowledge of use of DME or AE;Decreased knowledge of precautions      OT Treatment/Interventions:      OT Goals(Current goals can be found in the care plan section) Acute Rehab OT Goals Patient Stated Goal: to go home OT Goal Formulation: With patient Time For Goal Achievement: 12/07/16 Potential to Achieve Goals: Good  OT Frequency:     Barriers to D/C:            Co-evaluation              AM-PAC PT "6 Clicks" Daily Activity     Outcome Measure Help from another person eating meals?: None Help from another person taking care of personal grooming?: None Help from another person toileting, which includes using toliet, bedpan, or urinal?: A Little Help from another person bathing (including washing, rinsing, drying)?: A Little Help from another person to put on and taking off regular upper body clothing?: None Help from another person to put on and taking off regular lower body clothing?: A Little 6 Click Score: 21   End of Session Nurse Communication: Mobility status  Activity Tolerance: Patient tolerated treatment well Patient left: in chair;with call bell/phone within reach  OT Visit Diagnosis: Unsteadiness on feet (R26.81);Other abnormalities of gait and mobility (R26.89);Muscle weakness (generalized) (M62.81);Pain Pain - Right/Left: (Back) Pain - part of body: (Back)                Time: 7741-4239 OT Time Calculation (min): 22 min Charges:  OT General Charges $OT Visit: 1 Visit OT Evaluation $OT Eval Low Complexity: 1 Low G-Codes: OT G-codes **NOT FOR INPATIENT CLASS** Functional Assessment Tool Used: Clinical judgement Functional Limitation: Self care Self Care Current Status (R3202): At least 1 percent but less than 20 percent impaired, limited or restricted Self Care Goal Status (B3435): 0 percent impaired, limited  or restricted Self Care Discharge Status [phone]): At least 1 percent but less than 20 percent impaired, limited or restricted   [place], OTR/L Acute Rehab Pager: [phone] Office: [place] 11/23/2016, 10:16 AM

## 2016-11-23 NOTE — Anesthesia Postprocedure Evaluation (Signed)
Anesthesia Post Note  Patient: Laura Randall  Procedure(s) Performed: Bilateral Limbar two -three,  Lumbar three-four Laminotomies (Bilateral Back)     Patient location during evaluation: PACU Anesthesia Type: General Level of consciousness: awake and alert and oriented Pain management: pain level controlled Vital Signs Assessment: post-procedure vital signs reviewed and stable Respiratory status: spontaneous breathing, nonlabored ventilation, respiratory function stable and patient connected to nasal cannula oxygen Cardiovascular status: blood pressure returned to baseline and stable Postop Assessment: no apparent nausea or vomiting Anesthetic complications: no                 Charleston Vierling A.

## 2016-11-23 NOTE — Progress Notes (Signed)
Patient has signed and been given copy of consent for CGM/[employer] research study. Education done regarding application and changing CGM sensor (alternate every 10 days on back of arms), 12 hour warm-up, use of glucometer/where to buy strips, how to scan CGM for glucose reading and information for PCP. Patient has been given [employer] reader and first sensor for use. Patient has also been given educational packet regarding use CGM sensor including the 1-800 toll free number for any questions, problems or needs related to the [facility] sensors or reader.  Patient to be given Rx. For sensors with prescriptions/discharge paper work.   Explained that glucose readings will not be available until 12 hours after application. Patient understands that Diabetes coordinator will call them 2 times after discharge (between days 7-12 after d/c from hospital and between days 30-25 after d/c from hospital). Patient verbalizes understanding of use of Freestyle Libre CGM and was told that any issues with blood sugars/diabetes will need to be addressed by PCP.  Diabetes Quality of Life Survey administered to patient.   Thanks, Beryl MeagerJenny Krishika Bugge, RN, BC-ADM Inpatient Diabetes Coordinator Pager [phone] (8a-5p)

## 2016-11-30 ENCOUNTER — Telehealth: Payer: Self-pay

## 2018-11-23 IMAGING — CR DG LUMBAR SPINE 1V
1 series · 1 of 1 positions shown · non-contrast
Comparison: None.

CLINICAL DATA: Intraoperative localization for spine surgery.

EXAM:
LUMBAR SPINE - 1 VIEW

[xtable lateral]
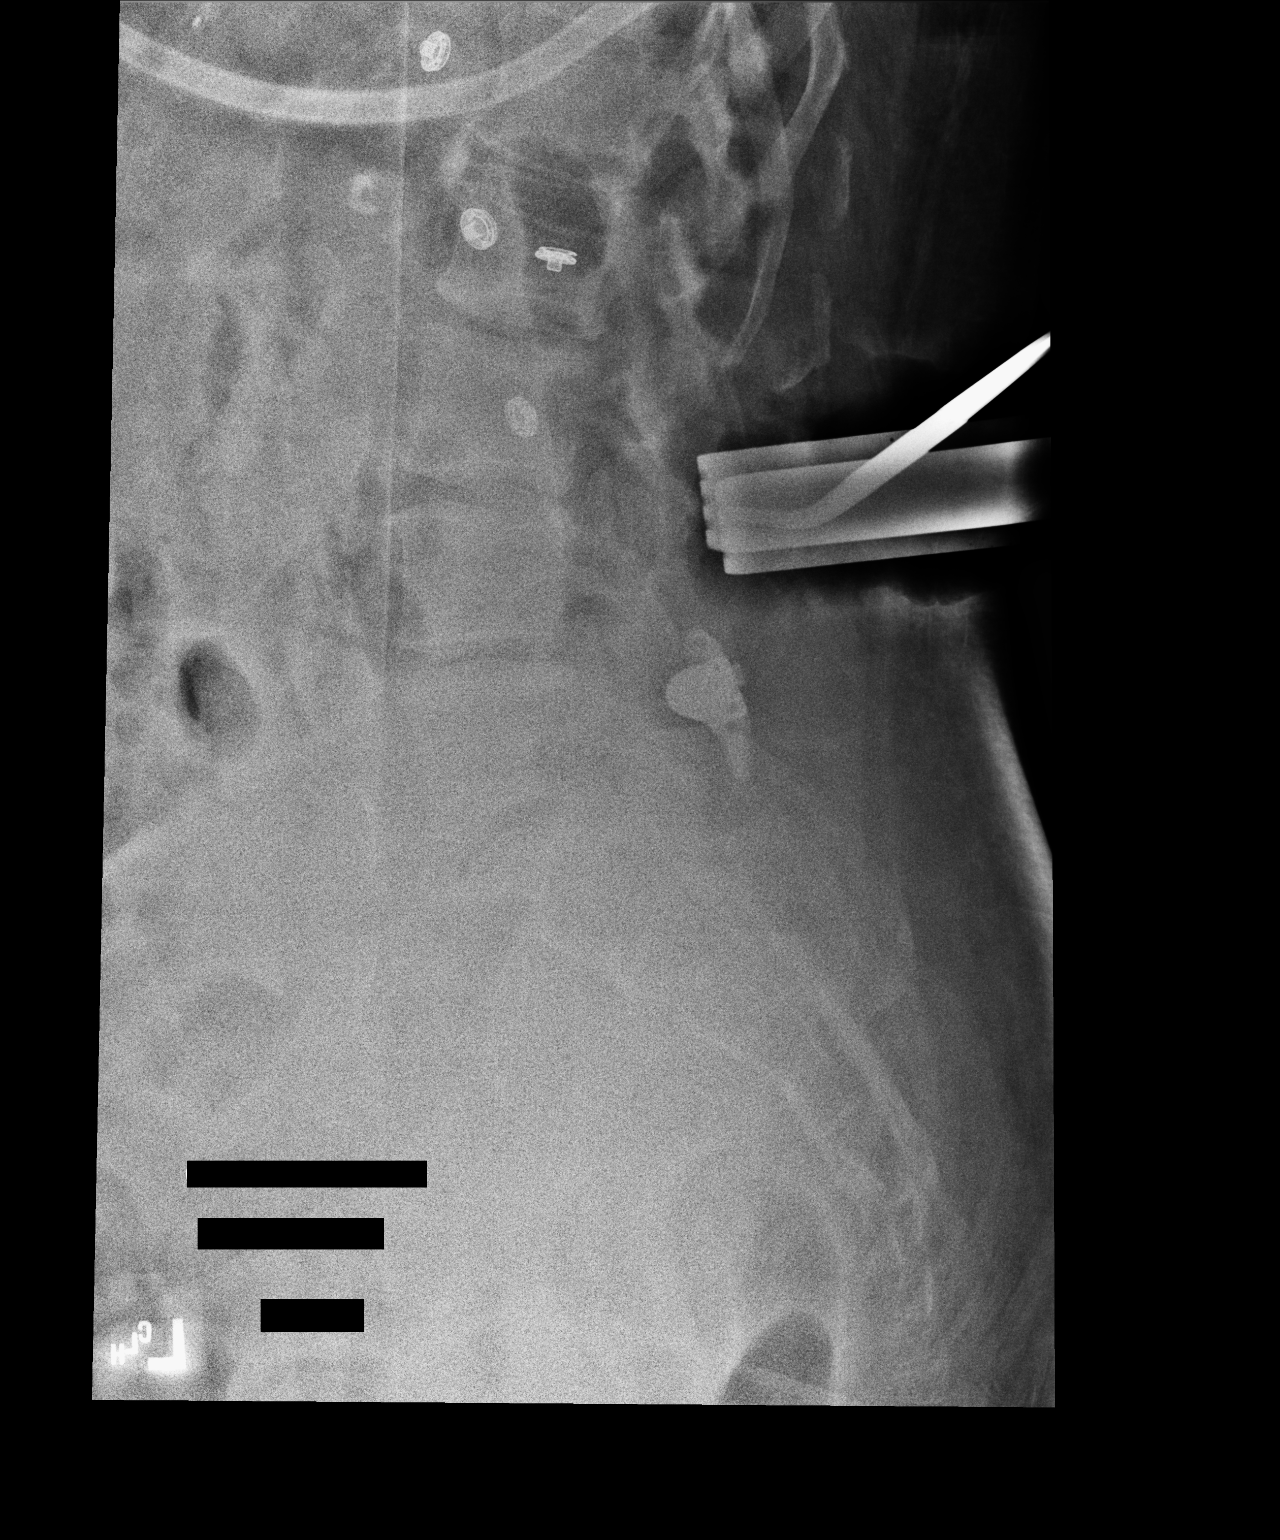

[1 of 1 positions shown; findings below may reference images not displayed]

FINDINGS: Lateral lumbar spine film from the operating room demonstrates
surgical retractors and a surgical instrument marking the L3-4 disc
space level. X stop fusion device noted at L4-5.
IMPRESSION: L3-4 marked intraoperatively.
